# Patient Record
Sex: Male | Born: 1960 | Race: White | Hispanic: No | Marital: Single | Smoking: Current every day smoker
Health system: Southern US, Community
[De-identification: ages and names within clinical notes are randomized; demographics above are authoritative.]

## PROBLEM LIST (undated history)

## (undated) DIAGNOSIS — H547 Unspecified visual loss: Secondary | ICD-10-CM

---

## 2006-09-08 ENCOUNTER — Emergency Department: Payer: Self-pay | Admitting: Emergency Medicine

## 2007-04-14 ENCOUNTER — Emergency Department: Payer: Self-pay | Admitting: Emergency Medicine

## 2009-10-19 ENCOUNTER — Emergency Department: Payer: Self-pay | Admitting: Emergency Medicine

## 2010-08-23 ENCOUNTER — Emergency Department: Payer: Self-pay | Admitting: Emergency Medicine

## 2014-11-19 ENCOUNTER — Emergency Department
Admission: EM | Admit: 2014-11-19 | Discharge: 2014-11-20 | Disposition: A | Discharge status: Home / Self Care | Payer: Self-pay | Attending: Emergency Medicine | Admitting: Emergency Medicine

## 2014-11-19 DIAGNOSIS — Y9389 Activity, other specified: Secondary | ICD-10-CM | POA: Insufficient documentation

## 2014-11-19 DIAGNOSIS — Y92009 Unspecified place in unspecified non-institutional (private) residence as the place of occurrence of the external cause: Secondary | ICD-10-CM | POA: Insufficient documentation

## 2014-11-19 DIAGNOSIS — Y998 Other external cause status: Secondary | ICD-10-CM | POA: Insufficient documentation

## 2014-11-19 DIAGNOSIS — X58XXXA Exposure to other specified factors, initial encounter: Secondary | ICD-10-CM | POA: Insufficient documentation

## 2014-11-19 DIAGNOSIS — S0501XA Injury of conjunctiva and corneal abrasion without foreign body, right eye, initial encounter: Secondary | ICD-10-CM | POA: Insufficient documentation

## 2014-11-19 MED ORDER — TETRACAINE HCL 0.5 % OP SOLN
1.0000 [drp] | Freq: Once | OPHTHALMIC | Status: AC
  Administered 2014-11-19: 2 [drp] via OPHTHALMIC

## 2014-11-19 MED ORDER — FLUORESCEIN SODIUM 1 MG OP STRP
ORAL_STRIP | OPHTHALMIC | Status: AC
  Filled 2014-11-19: qty 1

## 2014-11-19 MED ORDER — TETRACAINE HCL 0.5 % OP SOLN
OPHTHALMIC | Status: AC
  Administered 2014-11-19: 2 [drp] via OPHTHALMIC
  Filled 2014-11-19: qty 2

## 2014-11-19 MED ORDER — ERYTHROMYCIN 5 MG/GM OP OINT
1.0000 "application " | TOPICAL_OINTMENT | Freq: Four times a day (QID) | OPHTHALMIC | Status: DC
  Administered 2014-11-20: 1 via OPHTHALMIC

## 2014-11-19 NOTE — ED Notes (Signed)
Pt states was weed eating his yard at 1800 when something went into his right eye. Pt states "all i can see right now is a white blur" pt with red sclera noted.

## 2014-11-20 MED ORDER — ERYTHROMYCIN 5 MG/GM OP OINT
TOPICAL_OINTMENT | OPHTHALMIC | Status: AC
  Filled 2014-11-20: qty 1

## 2014-11-20 MED ORDER — KETOROLAC TROMETHAMINE 0.5 % OP SOLN: 1.0000 [drp] | Freq: Four times a day (QID) | OPHTHALMIC | Status: DC

## 2014-11-20 MED ORDER — GENTAMICIN SULFATE 0.3 % OP SOLN: 2.0000 [drp] | OPHTHALMIC | Status: DC

## 2014-11-20 NOTE — ED Provider Notes (Signed)
[facility] Emergency Department Provider Note ____________________________________________  Time seen: 2325  I have reviewed the triage vital signs and the nursing notes.   HISTORY  Chief Complaint Eye Injury  HPI Jack Wells is a 54 y.o. male with the complaint of sudden right eye foreign body sensation and pain, while weed eating, at home, about 2:30 this afternoon. He was delayed in reporting for evaluation because he had to wait for a ride. He describes right eye irritation, redness, pain, blurred vision. He is unclear as to what may contact with his eye. He denies wearing protective lenses or glasses today.  No past medical history on file.  There are no active problems to display for this patient.   No past surgical history on file.  Current Outpatient Rx  Name  Route  Sig  Dispense  Refill  . gentamicin (GARAMYCIN) 0.3 % ophthalmic solution   Right Eye   Place 2 drops into the right eye every 4 (four) hours.   5 mL   0   . ketorolac (ACULAR) 0.5 % ophthalmic solution   Right Eye   Place 1 drop into the right eye 4 (four) times daily.   5 mL   0     Allergies Review of patient's allergies indicates no known allergies.  No family history on file.  Social History History  Substance Use Topics  . Smoking status: Not on file  . Smokeless tobacco: Not on file  . Alcohol Use: Not on file    Review of Systems  Constitutional: Negative for fever. Eyes: Positive for visual changes, pain, redness, and foreign body sensation. ENT: Negative for sore throat. Neurological: Negative for headaches, focal weakness or numbness.  10-point ROS otherwise negative.  ____________________________________________   PHYSICAL EXAM:  VITAL SIGNS: ED Triage Vitals  Enc Vitals Group     BP 11/19/14 2223 132/86 mmHg     Pulse Rate 11/19/14 2223 73     Resp 11/19/14 2223 20     Temp 11/19/14 2223 97.8 F (36.6 C)     Temp Source 11/19/14 2223  Oral     SpO2 11/19/14 2223 99 %     Weight 11/19/14 2223 140 lb (63.504 kg)     Height 11/19/14 2223 5\' 9"  (1.753 m)     Head Cir --      Peak Flow --      Pain Score 11/19/14 2232 10     Pain Loc --      Pain Edu? --      Excl. in GC? --     Constitutional: Alert and oriented. Well appearing and in no distress. Eyes: Conjunctivae are injected on the right. Fluorescein dye uptake on the right eye, and a wide linear distribution overlying the iris. PERRL. Normal extraocular movements. ENT   Head: Normocephalic and atraumatic.   Nose: No congestion/rhinnorhea.   Mouth/Throat: Mucous membranes are moist.   Neck: No stridor. Respiratory: Normal respiratory effort without tachypnea nor retractions.  Neurologic:  Normal speech and language. No gross focal neurologic deficits are appreciated. Speech is normal. No gait instability. Skin:  Skin is warm, dry and intact. No rash noted. Psychiatric: Mood and affect are normal. Speech and behavior are normal. Patient exhibits appropriate insight and judgment. ____________________________________________  PROCEDURES  Procedure(s) performed: None  Critical Care performed: No  _____ Current Outpatient Rx  Name  Route  Sig  Dispense  Refill  . gentamicin (GARAMYCIN) 0.3 % ophthalmic solution   Right  Eye   Place 2 drops into the right eye every 4 (four) hours.   5 mL   0   . ketorolac (ACULAR) 0.5 % ophthalmic solution   Right Eye   Place 1 drop into the right eye 4 (four) times daily.   5 mL   0    _______________________________________   INITIAL IMPRESSION / ASSESSMENT AND PLAN / ED COURSE  Acute corneal abrasion.  Discussed return to ED or [facility] as needed.   Pertinent labs & imaging results that were available during my care of the patient were reviewed by me and considered in my medical decision making (see chart for details). ____________________________________________  FINAL CLINICAL  IMPRESSION(S) / ED DIAGNOSES  Final diagnoses:  Corneal abrasion, right, initial encounter     Lissa HoardJenise V Bacon Raevon Broom, PA-C 11/20/14 0028  Governor Rooksebecca Lord, MD 11/23/14 1257

## 2014-11-20 NOTE — Discharge Instructions (Signed)
Corneal Abrasion The cornea is the clear covering at the front and center of the eye. When looking at the colored portion of the eye (iris), you are looking through the cornea. This very thin tissue is made up of many layers. The surface layer is a single layer of cells (corneal epithelium) and is one of the most sensitive tissues in the body. If a scratch or injury causes the corneal epithelium to come off, it is called a corneal abrasion. If the injury extends to the tissues below the epithelium, the condition is called a corneal ulcer. CAUSES   Scratches.  Trauma.  Foreign body in the eye. Some people have recurrences of abrasions in the area of the original injury even after it has healed (recurrent erosion syndrome). Recurrent erosion syndrome generally improves and goes away with time. SYMPTOMS   Eye pain.  Difficulty or inability to keep the injured eye open.  The eye becomes very sensitive to light.  Recurrent erosions tend to happen suddenly, first thing in the morning, usually after waking up and opening the eye. DIAGNOSIS  Your health care provider can diagnose a corneal abrasion during an eye exam. Dye is usually placed in the eye using a drop or a small paper strip moistened by your tears. When the eye is examined with a special light, the abrasion shows up clearly because of the dye. TREATMENT   Small abrasions may be treated with antibiotic drops or ointment alone.  A pressure patch may be put over the eye. If this is done, follow your doctor's instructions for when to remove the patch. Do not drive or use machines while the eye patch is on. Judging distances is hard to do with a patch on. If the abrasion becomes infected and spreads to the deeper tissues of the cornea, a corneal ulcer can result. This is serious because it can cause corneal scarring. Corneal scars interfere with light passing through the cornea and cause a loss of vision in the involved eye. HOME CARE  INSTRUCTIONS  Use medicine or ointment as directed. Only take over-the-counter or prescription medicines for pain, discomfort, or fever as directed by your health care provider.  Do not drive or operate machinery if your eye is patched. Your ability to judge distances is impaired.  If your health care provider has given you a follow-up appointment, it is very important to keep that appointment. Not keeping the appointment could result in a severe eye infection or permanent loss of vision. If there is any problem keeping the appointment, let your health care provider know. SEEK MEDICAL CARE IF:   You have pain, light sensitivity, and a scratchy feeling in one eye or both eyes.  Your pressure patch keeps loosening up, and you can blink your eye under the patch after treatment.  Any kind of discharge develops from the eye after treatment or if the lids stick together in the morning.  You have the same symptoms in the morning as you did with the original abrasion days, weeks, or months after the abrasion healed. MAKE SURE YOU:   Understand these instructions.  Will watch your condition.  Will get help right away if you are not doing well or get worse. Document Released: 06/28/2000 Document Revised: 07/06/2013 Document Reviewed: 03/08/2013 [facility] Patient Information 2015 [employer] [place]. This information is not intended to replace advice given to you by your health care provider. Make sure you discuss any questions you have with your health care provider.   Use  the eye drops as directed.  Wear contacts to protect the eyes.  Return to the ED as needed for eye pain.

## 2017-12-18 ENCOUNTER — Encounter: Payer: Self-pay | Admitting: Physician Assistant

## 2017-12-18 ENCOUNTER — Other Ambulatory Visit: Payer: Self-pay

## 2017-12-18 ENCOUNTER — Emergency Department
Admission: EM | Admit: 2017-12-18 | Discharge: 2017-12-18 | Disposition: A | Discharge status: Home / Self Care | Payer: No Typology Code available for payment source | Attending: Emergency Medicine | Admitting: Emergency Medicine

## 2017-12-18 ENCOUNTER — Emergency Department: Payer: No Typology Code available for payment source

## 2017-12-18 DIAGNOSIS — Y999 Unspecified external cause status: Secondary | ICD-10-CM | POA: Diagnosis not present

## 2017-12-18 DIAGNOSIS — S161XXA Strain of muscle, fascia and tendon at neck level, initial encounter: Secondary | ICD-10-CM | POA: Insufficient documentation

## 2017-12-18 DIAGNOSIS — Y92410 Unspecified street and highway as the place of occurrence of the external cause: Secondary | ICD-10-CM | POA: Diagnosis not present

## 2017-12-18 DIAGNOSIS — Y9389 Activity, other specified: Secondary | ICD-10-CM | POA: Diagnosis not present

## 2017-12-18 DIAGNOSIS — S199XXA Unspecified injury of neck, initial encounter: Secondary | ICD-10-CM | POA: Diagnosis present

## 2017-12-18 DIAGNOSIS — Z79899 Other long term (current) drug therapy: Secondary | ICD-10-CM | POA: Diagnosis not present

## 2017-12-18 MED ORDER — BACLOFEN 10 MG PO TABS: 10.0000 mg | ORAL_TABLET | Freq: Every day | ORAL | 1 refills | Status: AC

## 2017-12-18 MED ORDER — ACETAMINOPHEN 325 MG PO TABS
650.0000 mg | ORAL_TABLET | Freq: Once | ORAL | Status: AC
  Administered 2017-12-18: 650 mg via ORAL
  Filled 2017-12-18: qty 2

## 2017-12-18 MED ORDER — MELOXICAM 15 MG PO TABS: 15.0000 mg | ORAL_TABLET | Freq: Every day | ORAL | 2 refills | Status: AC

## 2017-12-18 NOTE — Discharge Instructions (Signed)
Follow-up with your regular doctor if not better in 5 to 7 days.  Take medication as prescribed.  Apply ice to the neck and back.  In 3 days she can switch to wet heat followed by ice.  Return to emergency department if you are worsening.
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
## 2021-08-27 ENCOUNTER — Emergency Department
Admission: EM | Admit: 2021-08-27 | Discharge: 2021-08-27 | Disposition: A | Discharge status: Home / Self Care | Payer: Self-pay | Attending: Emergency Medicine | Admitting: Emergency Medicine

## 2021-08-27 ENCOUNTER — Emergency Department: Payer: Self-pay

## 2021-08-27 ENCOUNTER — Other Ambulatory Visit: Payer: Self-pay

## 2021-08-27 DIAGNOSIS — M79604 Pain in right leg: Secondary | ICD-10-CM | POA: Insufficient documentation

## 2021-08-27 DIAGNOSIS — R911 Solitary pulmonary nodule: Secondary | ICD-10-CM | POA: Insufficient documentation

## 2021-08-27 DIAGNOSIS — R059 Cough, unspecified: Secondary | ICD-10-CM | POA: Insufficient documentation

## 2021-08-27 DIAGNOSIS — R0602 Shortness of breath: Secondary | ICD-10-CM | POA: Insufficient documentation

## 2021-08-27 DIAGNOSIS — R0600 Dyspnea, unspecified: Secondary | ICD-10-CM | POA: Insufficient documentation

## 2021-08-27 DIAGNOSIS — R079 Chest pain, unspecified: Secondary | ICD-10-CM | POA: Insufficient documentation

## 2021-08-27 DIAGNOSIS — F17200 Nicotine dependence, unspecified, uncomplicated: Secondary | ICD-10-CM | POA: Insufficient documentation

## 2021-08-27 LAB — BASIC METABOLIC PANEL
Anion gap: 9 (ref 5–15)
BUN: 11 mg/dL (ref 8–23)
CO2: 25 mmol/L (ref 22–32)
Calcium: 9.3 mg/dL (ref 8.9–10.3)
Chloride: 103 mmol/L (ref 98–111)
Creatinine, Ser: 0.81 mg/dL (ref 0.61–1.24)
GFR, Estimated: >60 mL/min (ref >60)
Glucose, Bld: 88 mg/dL (ref 70–99)
Potassium: 3.8 mmol/L (ref 3.5–5.1)
Sodium: 137 mmol/L (ref 135–145)

## 2021-08-27 LAB — TROPONIN I (HIGH SENSITIVITY)
Troponin I (High Sensitivity): 5 ng/L (ref <18)
Troponin I (High Sensitivity): 5 ng/L (ref <18)

## 2021-08-27 LAB — D-DIMER, QUANTITATIVE: D-Dimer, Quant: <0.27 ug/mL-FEU (ref 0.00–0.50)

## 2021-08-27 LAB — CBC
HCT: 47 % (ref 39.0–52.0)
Hemoglobin: 15.5 g/dL (ref 13.0–17.0)
MCH: 31.5 pg (ref 26.0–34.0)
MCHC: 33 g/dL (ref 30.0–36.0)
MCV: 95.5 fL (ref 80.0–100.0)
Platelets: 159 10*3/uL (ref 150–400)
RBC: 4.92 MIL/uL (ref 4.22–5.81)
RDW: 12.8 % (ref 11.5–15.5)
WBC: 5.9 10*3/uL (ref 4.0–10.5)
nRBC: 0 % (ref 0.0–0.2)

## 2021-08-27 MED ORDER — ACETAMINOPHEN 500 MG PO TABS
1000.0000 mg | ORAL_TABLET | Freq: Once | ORAL | Status: AC
  Administered 2021-08-27: 1000 mg via ORAL
  Filled 2021-08-27: qty 2

## 2021-08-27 NOTE — ED Triage Notes (Signed)
Pt to ED for centralized cp for 2 weeks. Reports worsening today. +shob. RR Even and unlabored.

## 2021-08-27 NOTE — ED Provider Notes (Signed)
[facility] Provider Note    Event Date/Time   First MD Initiated Contact with Patient 08/27/21 1449     (approximate)   History   Chest Pain and Shortness of Breath   HPI  Jack Wells is a 62 y.o. male  with pmh tobacco use presents wth chest pain.  Symptoms been going on for the past 2 weeks.  Patient endorses a central sharp pain in his chest that is constant.  Pain is worse with coughing.  He has chronic cough from his chronic tobacco use.  Denies hemoptysis.  Denies fevers chills.  Does feel slightly dyspneic.  Pain is not worse with exertion or pleuritic.  Has some pain in the right lower leg over the last 2 days. The patient denies hx of prior DVT/PE, unilateral leg pain/swelling, hormone use, recent surgery, hx of cancer, prolonged immobilization, or hemoptysis.         History reviewed. No pertinent past medical history.  There are no problems to display for this patient.    Physical Exam  Triage Vital Signs: ED Triage Vitals  Enc Vitals Group     BP 08/27/21 1002 (!) 159/90     Pulse Rate 08/27/21 1002 85     Resp 08/27/21 1002 18     Temp 08/27/21 1002 98 F (36.7 C)     Temp src --      SpO2 08/27/21 1002 98 %     Weight 08/27/21 1002 140 lb (63.5 kg)     Height 08/27/21 1002 5\' 10"  (1.778 m)     Head Circumference --      Peak Flow --      Pain Score 08/27/21 1002 9     Pain Loc --      Pain Edu? --      Excl. in [place]? --     Most recent vital signs: Vitals:   08/27/21 1002 08/27/21 1358  BP: (!) 159/90 (!) 149/85  Pulse: 85 67  Resp: 18 18  Temp: 98 F (36.7 C)   SpO2: 98% 98%     General: Awake, no distress.  CV:  Good peripheral perfusion.  No lower extremity edema or asymmetry Resp:  Normal effort. No wheezing Abd:  No distention.  Neuro:             Awake, Alert, Oriented x 3  Other:     ED Results / Procedures / Treatments  Labs (all labs ordered are listed, but only abnormal results are  displayed) Labs Reviewed  BASIC METABOLIC PANEL  CBC  D-DIMER, QUANTITATIVE  TROPONIN I (HIGH SENSITIVITY)  TROPONIN I (HIGH SENSITIVITY)     EKG  EKG interpretation performed by myself: NSR, nml axis, nml intervals, no acute ischemic changes    RADIOLOGY Possible right upper pulmonary nodule, otherwise no acute cardiopulmonary process   PROCEDURES:  Critical Care performed: No   MEDICATIONS ORDERED IN ED: Medications  acetaminophen (TYLENOL) tablet 1,000 mg (1,000 mg Oral Given 08/27/21 1533)     IMPRESSION / MDM / ASSESSMENT AND PLAN / ED COURSE  I reviewed the triage vital signs and the nursing notes.                              Differential diagnosis includes, but is not limited to, pulmonary embolism, malignancy, ACS, pneumonia  This patient is a 61 year old male with past medical history of heavy tobacco use who  presents with 2 weeks of sharp centralized chest pain that is nonexertional.  He has some associated shortness of breath.  Vital signs are within normal limits.  On exam he is thin with clear lungs no asymmetry in his lower extremities.  I reviewed his EKG which has no acute ischemic changes.  Chest x-ray read as radiology as potential right upper lung nodule but no other acute process.  His symptomatology is really not consistent with ACS.  2 troponins are negative.  Am however concerned for pulmonary embolism especially if this lung nodule is a potential malignancy.  We will obtain a D-dimer.  Patient's D-dimer is negative.  Discussed the pulmonary nodule with the patient and need to follow-up to obtain CT.  No indication to obtain a CT today in the ED as this would not change acute management.     FINAL CLINICAL IMPRESSION(S) / ED DIAGNOSES   Final diagnoses:  Pulmonary nodule     Rx / DC Orders   ED Discharge Orders     None        Note:  This document was prepared using Dragon voice recognition software and may include unintentional  dictation errors.   Rada Hay, MD 08/27/21 [phone]

## 2021-08-27 NOTE — Discharge Instructions (Signed)
Your cardiac enzymes and the test for blood clots was all reassuring.  On your x-ray you may have a pulmonary nodule.  You will need to follow-up with a primary care doctor to have a CT of your chest to follow this.  We are not sure exactly why you are having chest pain.  You can take Tylenol and Motrin for the pain.

## 2021-09-20 ENCOUNTER — Encounter (HOSPITAL_COMMUNITY): Payer: Self-pay | Admitting: Radiology

## 2022-07-17 ENCOUNTER — Encounter: Payer: Self-pay | Admitting: Ophthalmology

## 2022-07-22 NOTE — Discharge Instructions (Signed)

## 2022-07-23 ENCOUNTER — Encounter: Payer: Self-pay | Admitting: Ophthalmology

## 2022-07-23 ENCOUNTER — Other Ambulatory Visit: Payer: Self-pay

## 2022-07-23 ENCOUNTER — Ambulatory Visit: Payer: Medicaid Other | Admitting: General Practice

## 2022-07-23 ENCOUNTER — Ambulatory Visit
Admission: RE | Admit: 2022-07-23 | Discharge: 2022-07-23 | Disposition: A | Discharge status: Home / Self Care | Payer: Medicaid Other | Attending: Ophthalmology | Admitting: Ophthalmology

## 2022-07-23 ENCOUNTER — Encounter
Admission: RE | Disposition: A | Discharge status: Home / Self Care | Payer: Self-pay | Source: Home / Self Care | Attending: Ophthalmology

## 2022-07-23 DIAGNOSIS — F17210 Nicotine dependence, cigarettes, uncomplicated: Secondary | ICD-10-CM | POA: Diagnosis not present

## 2022-07-23 DIAGNOSIS — Z79899 Other long term (current) drug therapy: Secondary | ICD-10-CM | POA: Diagnosis not present

## 2022-07-23 DIAGNOSIS — E1136 Type 2 diabetes mellitus with diabetic cataract: Secondary | ICD-10-CM | POA: Insufficient documentation

## 2022-07-23 DIAGNOSIS — I10 Essential (primary) hypertension: Secondary | ICD-10-CM | POA: Insufficient documentation

## 2022-07-23 DIAGNOSIS — H2512 Age-related nuclear cataract, left eye: Secondary | ICD-10-CM | POA: Diagnosis present

## 2022-07-23 HISTORY — DX: Unspecified visual loss: H54.7

## 2022-07-23 HISTORY — PX: CATARACT EXTRACTION W/PHACO: SHX586

## 2022-07-23 SURGERY — PHACOEMULSIFICATION, CATARACT, WITH IOL INSERTION
Anesthesia: Monitor Anesthesia Care | Laterality: Left

## 2022-07-23 MED ORDER — BRIMONIDINE TARTRATE-TIMOLOL 0.2-0.5 % OP SOLN
OPHTHALMIC | Status: DC | PRN
  Administered 2022-07-23: 1 [drp] via OPHTHALMIC

## 2022-07-23 MED ORDER — SIGHTPATH DOSE#1 BSS IO SOLN
INTRAOCULAR | Status: DC | PRN
  Administered 2022-07-23: 15 mL via INTRAOCULAR

## 2022-07-23 MED ORDER — TRYPAN BLUE 0.06 % IO SOSY
PREFILLED_SYRINGE | INTRAOCULAR | Status: DC | PRN
  Administered 2022-07-23: .5 mL via INTRAOCULAR

## 2022-07-23 MED ORDER — ONDANSETRON HCL 4 MG/2ML IJ SOLN: 4.0000 mg | Freq: Once | INTRAMUSCULAR | Status: DC | PRN

## 2022-07-23 MED ORDER — SIGHTPATH DOSE#1 NA CHONDROIT SULF-NA HYALURON 40-17 MG/ML IO SOLN
INTRAOCULAR | Status: DC | PRN
  Administered 2022-07-23: 1 mL via INTRAOCULAR

## 2022-07-23 MED ORDER — SIGHTPATH DOSE#1 BSS IO SOLN
INTRAOCULAR | Status: DC | PRN
  Administered 2022-07-23: 85 mL via OPHTHALMIC

## 2022-07-23 MED ORDER — LACTATED RINGERS IV SOLN: INTRAVENOUS | Status: DC

## 2022-07-23 MED ORDER — ARMC OPHTHALMIC DILATING DROPS
1.0000 | OPHTHALMIC | Status: DC | PRN
  Administered 2022-07-23 (×3): 1 via OPHTHALMIC

## 2022-07-23 MED ORDER — TETRACAINE HCL 0.5 % OP SOLN
1.0000 [drp] | OPHTHALMIC | Status: DC | PRN
  Administered 2022-07-23 (×3): 1 [drp] via OPHTHALMIC

## 2022-07-23 MED ORDER — FENTANYL CITRATE (PF) 100 MCG/2ML IJ SOLN
INTRAMUSCULAR | Status: DC | PRN
  Administered 2022-07-23: 50 ug via INTRAVENOUS

## 2022-07-23 MED ORDER — FENTANYL CITRATE PF 50 MCG/ML IJ SOSY: 25.0000 ug | PREFILLED_SYRINGE | INTRAMUSCULAR | Status: DC | PRN

## 2022-07-23 MED ORDER — MIDAZOLAM HCL 2 MG/2ML IJ SOLN
INTRAMUSCULAR | Status: DC | PRN
  Administered 2022-07-23: 2 mg via INTRAVENOUS

## 2022-07-23 MED ORDER — SIGHTPATH DOSE#1 BSS IO SOLN
INTRAOCULAR | Status: DC | PRN
  Administered 2022-07-23: 2 mL

## 2022-07-23 MED ORDER — MOXIFLOXACIN HCL 0.5 % OP SOLN
OPHTHALMIC | Status: DC | PRN
  Administered 2022-07-23: .2 mL via OPHTHALMIC

## 2022-07-23 SURGICAL SUPPLY — 13 items
CANNULA ANT/CHMB 27G (MISCELLANEOUS) IMPLANT
CANNULA ANT/CHMB 27GA (MISCELLANEOUS) ×1 IMPLANT
CATARACT SUITE SIGHTPATH (MISCELLANEOUS) ×1 IMPLANT
FEE CATARACT SUITE SIGHTPATH (MISCELLANEOUS) ×1 IMPLANT
GLOVE SURG ENC TEXT LTX SZ8 (GLOVE) ×1 IMPLANT
GLOVE SURG TRIUMPH 8.0 PF LTX (GLOVE) ×1 IMPLANT
LENS IOL TECNIS EYHANCE 23.0 (Intraocular Lens) IMPLANT
NDL FILTER BLUNT 18X1 1/2 (NEEDLE) ×1 IMPLANT
NEEDLE FILTER BLUNT 18X1 1/2 (NEEDLE) ×1 IMPLANT
SUT ETHILON 10-0 CS-B-6CS-B-6 (SUTURE) ×1
SUTURE EHLN 10-0 CS-B-6CS-B-6 (SUTURE) IMPLANT
SYR 3ML LL SCALE MARK (SYRINGE) ×1 IMPLANT
WATER STERILE IRR 250ML POUR (IV SOLUTION) ×1 IMPLANT

## 2022-07-23 NOTE — Anesthesia Preprocedure Evaluation (Signed)
Anesthesia Evaluation  Patient identified by MRN, date of birth, ID band Patient awake    Reviewed: Allergy & Precautions, H&P , NPO status , Patient's Chart, lab work & pertinent test results, reviewed documented beta blocker date and time   Airway Mallampati: II  TM Distance: >3 FB Neck ROM: full    Dental no notable dental hx. (+) Teeth Intact   Pulmonary neg pulmonary ROS, Current Smoker   Pulmonary exam normal breath sounds clear to auscultation       Cardiovascular Exercise Tolerance: Good hypertension, On Medications negative cardio ROS  Rhythm:regular Rate:Normal     Neuro/Psych negative neurological ROS  negative psych ROS   GI/Hepatic negative GI ROS, Neg liver ROS,,,  Endo/Other  negative endocrine ROSdiabetes, Well Controlled    Renal/GU      Musculoskeletal   Abdominal   Peds  Hematology negative hematology ROS (+)   Anesthesia Other Findings   Reproductive/Obstetrics negative OB ROS                             Anesthesia Physical Anesthesia Plan  ASA: 3  Anesthesia Plan: MAC   Post-op Pain Management:    Induction:   PONV Risk Score and Plan:   Airway Management Planned:   Additional Equipment:   Intra-op Plan:   Post-operative Plan:   Informed Consent: I have reviewed the patients History and Physical, chart, labs and discussed the procedure including the risks, benefits and alternatives for the proposed anesthesia with the patient or authorized representative who has indicated his/her understanding and acceptance.       Plan Discussed with: CRNA  Anesthesia Plan Comments:        Anesthesia Quick Evaluation

## 2022-07-23 NOTE — H&P (Signed)
Jack Wells   Primary Care Physician:  [facility] Ophthalmologist: Dr. George Ina  Pre-Procedure History & Physical: HPI:  Jack Wells is a 62 y.o. male here for cataract surgery.   Past Medical History:  Diagnosis Date   Blind    sees shadows    History reviewed. No pertinent surgical history.  Prior to Admission medications   Not on File    Allergies as of 06/17/2022   (No Known Allergies)    History reviewed. No pertinent family history.  Social History   Socioeconomic History   Marital status: Single    Spouse name: Not on file   Number of children: Not on file   Years of education: Not on file   Highest education level: Not on file  Occupational History   Not on file  Tobacco Use   Smoking status: Every Day    Packs/day: 1.00    Years: 45.00    Total pack years: 45.00    Types: Cigarettes   Smokeless tobacco: Never   Tobacco comments:    Started smoking as teenager  Vaping Use   Vaping Use: Never used  Substance and Sexual Activity   Alcohol use: Yes    Comment: occasional beer   Drug use: Not Currently   Sexual activity: Not on file  Other Topics Concern   Not on file  Social History Narrative   Not on file   Social Determinants of Health   Financial Resource Strain: Not on file  Food Insecurity: Not on file  Transportation Needs: Not on file  Physical Activity: Not on file  Stress: Not on file  Social Connections: Not on file  Intimate Partner Violence: Not on file    Review of Systems: See HPI, otherwise negative ROS  Physical Exam: BP (!) 143/75   Pulse 68   Temp 97.7 F (36.5 C) (Temporal)   Resp 18   Ht 5\' 10"  (1.778 m)   Wt 63.5 kg   SpO2 98%   BMI 20.09 kg/m  General:   Alert, cooperative in NAD Head:  Normocephalic and atraumatic. Respiratory:  Normal work of breathing. Cardiovascular:  RRR  Impression/Plan: Jack Wells is here for cataract surgery.  Risks, benefits, limitations, and  alternatives regarding cataract surgery have been reviewed with the patient.  Questions have been answered.  All parties agreeable.   Birder Robson, MD  07/23/2022, 11:00 AM

## 2022-07-23 NOTE — Transfer of Care (Signed)
Immediate Anesthesia Transfer of Care Note  Patient: Jack Wells  Procedure(s) Performed: CATARACT EXTRACTION PHACO AND INTRAOCULAR LENS PLACEMENT (IOC) LEFT VISION BLUE 33.23 02:25.4 (Left)  Patient Location: PACU  Anesthesia Type:MAC  Level of Consciousness: awake, alert , and oriented  Airway & Oxygen Therapy: Patient Spontanous Breathing  Post-op Assessment: Report given to RN  Post vital signs: Reviewed and stable  Last Vitals:  Vitals Value Taken Time  BP 138/102 07/23/22 1137  Temp 36.2 C 07/23/22 1136  Pulse 75 07/23/22 1138  Resp 15 07/23/22 1138  SpO2 97 % 07/23/22 1138  Vitals shown include unvalidated device data.  Last Pain:  Vitals:   07/23/22 1136  TempSrc:   PainSc: 0-No pain         Complications: No notable events documented.

## 2022-07-23 NOTE — Anesthesia Postprocedure Evaluation (Signed)
Anesthesia Post Note  Patient: Romney Compean  Procedure(s) Performed: CATARACT EXTRACTION PHACO AND INTRAOCULAR LENS PLACEMENT (IOC) LEFT VISION BLUE 33.23 02:25.4 (Left)  Patient location during evaluation: PACU Anesthesia Type: MAC Level of consciousness: awake and alert Pain management: pain level controlled Vital Signs Assessment: post-procedure vital signs reviewed and stable Respiratory status: spontaneous breathing, nonlabored ventilation, respiratory function stable and patient connected to nasal cannula oxygen Cardiovascular status: stable and blood pressure returned to baseline Postop Assessment: no apparent nausea or vomiting Anesthetic complications: no   No notable events documented.   Last Vitals:  Vitals:   07/23/22 1136 07/23/22 1141  BP: (!) 138/102 116/85  Pulse: 71 66  Resp: 14 10  Temp: (!) 36.2 C (!) 36.2 C  SpO2: 97% 97%    Last Pain:  Vitals:   07/23/22 1141  TempSrc:   PainSc: 0-No pain                 Molli Barrows

## 2022-07-23 NOTE — Op Note (Signed)
PREOPERATIVE DIAGNOSIS:  Nuclear sclerotic cataract of the left eye.   POSTOPERATIVE DIAGNOSIS:  Nuclear sclerotic cataract of the left eye.   OPERATIVE PROCEDURE:ORPROCALL@   SURGEON:  Birder Robson, MD.   ANESTHESIA:  Anesthesiologist: Molli Barrows, MD CRNA: Gigi Gin, CRNA  1.      Managed anesthesia care. 2.     0.34ml of Shugarcaine was instilled following the paracentesis   COMPLICATIONS:  Vision Blue was used to stain the anterior capsule due to very poor/ no visualization of the red reflex.    TECHNIQUE:   Stop and chop   DESCRIPTION OF PROCEDURE:  The patient was examined and consented in the preoperative holding area where the aforementioned topical anesthesia was applied to the left eye and then brought back to the Operating Room where the left eye was prepped and draped in the usual sterile ophthalmic fashion and a lid speculum was placed. A paracentesis was created with the side port blade and the anterior chamber was filled with viscoelastic. A near clear corneal incision was performed with the steel keratome. A continuous curvilinear capsulorrhexis was performed with a cystotome followed by the capsulorrhexis forceps. Hydrodissection and hydrodelineation were carried out with BSS on a blunt cannula. The lens was removed in a stop and chop  technique and the remaining cortical material was removed with the irrigation-aspiration handpiece. The capsular bag was inflated with viscoelastic and the Technis ZCB00 lens was placed in the capsular bag without complication. The remaining viscoelastic was removed from the eye with the irrigation-aspiration handpiece. The wounds were hydrated. A single 10-0 suture was placed. The anterior chamber was flushed with BSS and the eye was inflated to physiologic pressure. 0.31ml Vigamox was placed in the anterior chamber. The wounds were found to be water tight. The eye was dressed with Combigan. The patient was given protective glasses to  wear throughout the day and a shield with which to sleep tonight. The patient was also given drops with which to begin a drop regimen today and will follow-up with me in one day. Implant Name Type Inv. Item Serial No. Manufacturer Lot No. LRB No. Used Action  LENS IOL TECNIS EYHANCE 23.0 - [id number] Intraocular Lens LENS IOL TECNIS EYHANCE 23.0 [id number] [employer]  Left 1 Implanted    Procedure(s): CATARACT EXTRACTION PHACO AND INTRAOCULAR LENS PLACEMENT (IOC) LEFT VISION BLUE 33.23 02:25.4 (Left)  Electronically signed: Birder Robson 07/23/2022 11:36 AM

## 2022-07-24 ENCOUNTER — Encounter: Payer: Self-pay | Admitting: Ophthalmology

## 2022-08-05 NOTE — Discharge Instructions (Signed)
   Cataract Surgery, Care After ? ?This sheet gives you information about how to care for yourself after your surgery.  Your ophthalmologist may also give you more specific instructions.  If you have problems or questions, contact your doctor at [facility], [phone]. ? ?What can I expect after the surgery? ?It is common to have: ?Itching ?Foreign body sensation (feels like a grain of sand in the eye) ?Watery discharge (excess tearing) ?Sensitivity to light and touch ?Bruising in or around the eye ?Mild blurred vision ? ?Follow these instructions at home: ?Do not touch or rub your eyes. ?You may be told to wear a protective shield or sunglasses to protect your eyes. ?Do not put a contact lens in the operative eye unless your doctor approves. ?Keep the lids and face clean and dry. ?Do not allow water to hit you directly in the face while showering. ?Keep soap and shampoo out of your eyes. ?Do not use eye makeup for 1 week. ? ?Check your eye every day for signs of infection.  Watch for: ?Redness, swelling, or pain. ?Fluid, blood or pus. ?Worsening vision. ?Worsening sensitivity to light or touch. ? ?Activity: ?During the first day, avoid bending over and reading.  You may resume reading and bending the next day. ?Do not drive or use heavy machinery for at least 24 hours. ?Avoid strenuous activities for 1 week.  Activities such as walking, treadmill, exercise bike, and climbing stairs are okay. ?Do not lift heavy (>20 pound) objects for 1 week. ?Do not do yardwork, gardening, or dirty housework (mopping, cleaning bathrooms, vacuuming, etc.) for 1 week. ?Do not swim or use a hot tub for 2 weeks. ?Ask your doctor when you can return to work. ? ?General Instructions: ?Take or apply prescription and over-the-counter medicines as directed by your doctor, including eyedrops and ointments. ?Resume medications discontinued prior to surgery, unless told otherwise by your doctor. ?Keep all follow up appointments as  scheduled. ? ?Contact a health care provider if: ?You have increased bruising around your eye. ?You have pain that is not helped with medication. ?You have a fever. ?You have fluid, pus, or blood coming from your eye or incision. ?Your sensitivity to light gets worse. ?You have spots (floaters) of flashing lights in your vision. ?You have nausea or vomiting. ? ?Go to the nearest emergency room or call 911 if: ?You have sudden loss of vision. ?You have severe, worsening eye pain. ? ?

## 2022-08-06 ENCOUNTER — Encounter
Admission: RE | Disposition: A | Discharge status: Home / Self Care | Payer: Self-pay | Source: Home / Self Care | Attending: Ophthalmology

## 2022-08-06 ENCOUNTER — Encounter: Payer: Self-pay | Admitting: Ophthalmology

## 2022-08-06 ENCOUNTER — Other Ambulatory Visit: Payer: Self-pay

## 2022-08-06 ENCOUNTER — Ambulatory Visit
Admission: RE | Admit: 2022-08-06 | Discharge: 2022-08-06 | Disposition: A | Discharge status: Home / Self Care | Payer: Medicaid Other | Attending: Ophthalmology | Admitting: Ophthalmology

## 2022-08-06 ENCOUNTER — Ambulatory Visit: Payer: Medicaid Other | Admitting: Anesthesiology

## 2022-08-06 DIAGNOSIS — H2511 Age-related nuclear cataract, right eye: Secondary | ICD-10-CM | POA: Diagnosis not present

## 2022-08-06 DIAGNOSIS — E119 Type 2 diabetes mellitus without complications: Secondary | ICD-10-CM | POA: Diagnosis not present

## 2022-08-06 DIAGNOSIS — H547 Unspecified visual loss: Secondary | ICD-10-CM | POA: Diagnosis not present

## 2022-08-06 DIAGNOSIS — I10 Essential (primary) hypertension: Secondary | ICD-10-CM | POA: Insufficient documentation

## 2022-08-06 DIAGNOSIS — F17210 Nicotine dependence, cigarettes, uncomplicated: Secondary | ICD-10-CM | POA: Diagnosis not present

## 2022-08-06 HISTORY — PX: CATARACT EXTRACTION W/PHACO: SHX586

## 2022-08-06 SURGERY — PHACOEMULSIFICATION, CATARACT, WITH IOL INSERTION
Anesthesia: Monitor Anesthesia Care | Site: Eye | Laterality: Right

## 2022-08-06 MED ORDER — MIDAZOLAM HCL 2 MG/2ML IJ SOLN
INTRAMUSCULAR | Status: DC | PRN
  Administered 2022-08-06: 2 mg via INTRAVENOUS

## 2022-08-06 MED ORDER — ONDANSETRON HCL 4 MG/2ML IJ SOLN: 4.0000 mg | Freq: Once | INTRAMUSCULAR | Status: DC | PRN

## 2022-08-06 MED ORDER — MOXIFLOXACIN HCL 0.5 % OP SOLN
OPHTHALMIC | Status: DC | PRN
  Administered 2022-08-06: .2 mL via OPHTHALMIC

## 2022-08-06 MED ORDER — BRIMONIDINE TARTRATE-TIMOLOL 0.2-0.5 % OP SOLN
OPHTHALMIC | Status: DC | PRN
  Administered 2022-08-06: 1 [drp] via OPHTHALMIC

## 2022-08-06 MED ORDER — ACETAMINOPHEN 160 MG/5ML PO SOLN: 325.0000 mg | ORAL | Status: DC | PRN

## 2022-08-06 MED ORDER — ARMC OPHTHALMIC DILATING DROPS
1.0000 | OPHTHALMIC | Status: DC | PRN
  Administered 2022-08-06 (×3): 1 via OPHTHALMIC

## 2022-08-06 MED ORDER — SIGHTPATH DOSE#1 BSS IO SOLN
INTRAOCULAR | Status: DC | PRN
  Administered 2022-08-06: 15 mL via INTRAOCULAR

## 2022-08-06 MED ORDER — SIGHTPATH DOSE#1 BSS IO SOLN
INTRAOCULAR | Status: DC | PRN
  Administered 2022-08-06: 84 mL via OPHTHALMIC

## 2022-08-06 MED ORDER — ACETAMINOPHEN 325 MG PO TABS: 650.0000 mg | ORAL_TABLET | Freq: Once | ORAL | Status: DC | PRN

## 2022-08-06 MED ORDER — TETRACAINE HCL 0.5 % OP SOLN
1.0000 [drp] | OPHTHALMIC | Status: DC | PRN
  Administered 2022-08-06 (×3): 1 [drp] via OPHTHALMIC

## 2022-08-06 MED ORDER — LACTATED RINGERS IV SOLN: INTRAVENOUS | Status: DC

## 2022-08-06 MED ORDER — TRYPAN BLUE 0.06 % IO SOSY
PREFILLED_SYRINGE | INTRAOCULAR | Status: DC | PRN
  Administered 2022-08-06: .5 mL via INTRAOCULAR

## 2022-08-06 MED ORDER — SIGHTPATH DOSE#1 NA CHONDROIT SULF-NA HYALURON 40-17 MG/ML IO SOLN
INTRAOCULAR | Status: DC | PRN
  Administered 2022-08-06: 1 mL via INTRAOCULAR

## 2022-08-06 MED ORDER — SIGHTPATH DOSE#1 BSS IO SOLN
INTRAOCULAR | Status: DC | PRN
  Administered 2022-08-06: 2 mL

## 2022-08-06 MED ORDER — FENTANYL CITRATE (PF) 100 MCG/2ML IJ SOLN
INTRAMUSCULAR | Status: DC | PRN
  Administered 2022-08-06: 50 ug via INTRAVENOUS

## 2022-08-06 SURGICAL SUPPLY — 15 items
CANNULA ANT/CHMB 27G (MISCELLANEOUS) IMPLANT
CANNULA ANT/CHMB 27GA (MISCELLANEOUS) ×1 IMPLANT
CATARACT SUITE SIGHTPATH (MISCELLANEOUS) ×1 IMPLANT
FEE CATARACT SUITE SIGHTPATH (MISCELLANEOUS) ×1 IMPLANT
GLOVE BIOGEL PI IND STRL 8 (GLOVE) ×1 IMPLANT
GLOVE SURG ENC TEXT LTX SZ8 (GLOVE) ×1 IMPLANT
LENS IOL TECNIS EYHANCE 22.5 (Intraocular Lens) IMPLANT
NDL FILTER BLUNT 18X1 1/2 (NEEDLE) ×1 IMPLANT
NEEDLE FILTER BLUNT 18X1 1/2 (NEEDLE) ×1 IMPLANT
PACK VIT ANT 23G (MISCELLANEOUS) IMPLANT
RING MALYGIN (MISCELLANEOUS) IMPLANT
SUT ETHILON 10-0 CS-B-6CS-B-6 (SUTURE)
SUTURE EHLN 10-0 CS-B-6CS-B-6 (SUTURE) IMPLANT
SYR 3ML LL SCALE MARK (SYRINGE) ×1 IMPLANT
WATER STERILE IRR 250ML POUR (IV SOLUTION) ×1 IMPLANT

## 2022-08-06 NOTE — Transfer of Care (Signed)
Immediate Anesthesia Transfer of Care Note  Patient: Jack Wells  Procedure(s) Performed: CATARACT EXTRACTION PHACO AND INTRAOCULAR LENS PLACEMENT (IOC) RIGHT VISION BLUE 39.36 2:41.0 (Right: Eye)  Patient Location: PACU  Anesthesia Type:MAC  Level of Consciousness: awake, alert , and oriented  Airway & Oxygen Therapy: Patient Spontanous Breathing  Post-op Assessment: Report given to RN  Post vital signs: Reviewed and stable  Last Vitals:  Vitals Value Taken Time  BP 120/75 08/06/22 1318  Temp 36.1 C 08/06/22 1318  Pulse 71 08/06/22 1319  Resp 9 08/06/22 1319  SpO2 98 % 08/06/22 1319  Vitals shown include unvalidated device data.  Last Pain:  Vitals:   08/06/22 1318  TempSrc:   PainSc: 0-No pain         Complications: No notable events documented.

## 2022-08-06 NOTE — H&P (Signed)
[place]   Primary Care Physician:  [facility] Ophthalmologist: Dr. George Ina  Pre-Procedure History & Physical: HPI:  Jack Wells is a 62 y.o. male here for cataract surgery.   Past Medical History:  Diagnosis Date   Blind    sees shadows    Past Surgical History:  Procedure Laterality Date   CATARACT EXTRACTION W/PHACO Left 07/23/2022   Procedure: CATARACT EXTRACTION PHACO AND INTRAOCULAR LENS PLACEMENT (IOC) LEFT VISION BLUE 33.23 02:25.4;  Surgeon: Birder Robson, MD;  Location: [facility];  Service: Ophthalmology;  Laterality: Left;    Prior to Admission medications   Not on File    Allergies as of 06/17/2022   (No Known Allergies)    History reviewed. No pertinent family history.  Social History   Socioeconomic History   Marital status: Single    Spouse name: Not on file   Number of children: Not on file   Years of education: Not on file   Highest education level: Not on file  Occupational History   Not on file  Tobacco Use   Smoking status: Every Day    Packs/day: 1.00    Years: 45.00    Total pack years: 45.00    Types: Cigarettes   Smokeless tobacco: Never   Tobacco comments:    Started smoking as teenager  Vaping Use   Vaping Use: Never used  Substance and Sexual Activity   Alcohol use: Yes    Comment: occasional beer   Drug use: Not Currently   Sexual activity: Not on file  Other Topics Concern   Not on file  Social History Narrative   Not on file   Social Determinants of Health   Financial Resource Strain: Not on file  Food Insecurity: Not on file  Transportation Needs: Not on file  Physical Activity: Not on file  Stress: Not on file  Social Connections: Not on file  Intimate Partner Violence: Not on file    Review of Systems: See HPI, otherwise negative ROS  Physical Exam: BP 138/80   Temp 97.9 F (36.6 C) (Tympanic)   Resp 12   Ht 5\' 10"  (1.778 m)   Wt 68 kg   SpO2 97%   BMI 21.51  kg/m  General:   Alert, cooperative in NAD Head:  Normocephalic and atraumatic. Respiratory:  Normal work of breathing. Cardiovascular:  RRR  Impression/Plan: Jack Wells is here for cataract surgery.  Risks, benefits, limitations, and alternatives regarding cataract surgery have been reviewed with the patient.  Questions have been answered.  All parties agreeable.   Birder Robson, MD  08/06/2022, 12:46 PM

## 2022-08-06 NOTE — Anesthesia Preprocedure Evaluation (Addendum)
Anesthesia Evaluation  Patient identified by MRN, date of birth, ID band Patient awake    Reviewed: Allergy & Precautions, NPO status , Patient's Chart, lab work & pertinent test results  History of Anesthesia Complications Negative for: history of anesthetic complications  Airway Mallampati: I   Neck ROM: Full    Dental  (+) Missing, Chipped   Pulmonary Current Smoker (1 ppd)Patient did not abstain from smoking.   Pulmonary exam normal breath sounds clear to auscultation       Cardiovascular hypertension, Normal cardiovascular exam Rhythm:Regular Rate:Normal     Neuro/Psych Blindness     GI/Hepatic negative GI ROS,,,  Endo/Other  diabetes, Type 2    Renal/GU negative Renal ROS     Musculoskeletal   Abdominal   Peds  Hematology negative hematology ROS (+)   Anesthesia Other Findings   Reproductive/Obstetrics                             Anesthesia Physical Anesthesia Plan  ASA: 2  Anesthesia Plan: MAC   Post-op Pain Management:    Induction: Intravenous  PONV Risk Score and Plan: 0 and Treatment may vary due to age or medical condition, Midazolam and TIVA  Airway Management Planned: Natural Airway and Nasal Cannula  Additional Equipment:   Intra-op Plan:   Post-operative Plan:   Informed Consent: I have reviewed the patients History and Physical, chart, labs and discussed the procedure including the risks, benefits and alternatives for the proposed anesthesia with the patient or authorized representative who has indicated his/her understanding and acceptance.     Dental advisory given  Plan Discussed with: CRNA  Anesthesia Plan Comments: (LMA/GETA backup discussed.  Patient consented for risks of anesthesia including but not limited to:  - adverse reactions to medications - damage to eyes, teeth, lips or other oral mucosa - nerve damage due to positioning  - sore  throat or hoarseness - damage to heart, brain, nerves, lungs, other parts of body or loss of life  Informed patient about role of CRNA in peri- and intra-operative care.  Patient voiced understanding.)        Anesthesia Quick Evaluation

## 2022-08-06 NOTE — Op Note (Signed)
PREOPERATIVE DIAGNOSIS:  Nuclear sclerotic cataract of the right eye.   POSTOPERATIVE DIAGNOSIS:  Cataract   OPERATIVE PROCEDURE:ORPROCALL@   SURGEON:  Birder Robson, MD.   ANESTHESIA:  Anesthesiologist: Darrin Nipper, MD CRNA: Gigi Gin, CRNA  1.      Managed anesthesia care. 2.      0.65ml of Shugarcaine was instilled in the eye following the paracentesis.   COMPLICATIONS: Vision Blue was used to stain the anterior capsule due to very poor/ no visualization of the red reflex.    TECHNIQUE:   Stop and chop   DESCRIPTION OF PROCEDURE:  The patient was examined and consented in the preoperative holding area where the aforementioned topical anesthesia was applied to the right eye and then brought back to the Operating Room where the right eye was prepped and draped in the usual sterile ophthalmic fashion and a lid speculum was placed. A paracentesis was created with the side port blade and the anterior chamber was filled with viscoelastic. A near clear corneal incision was performed with the steel keratome. A continuous curvilinear capsulorrhexis was performed with a cystotome followed by the capsulorrhexis forceps. Hydrodissection and hydrodelineation were carried out with BSS on a blunt cannula. The lens was removed in a stop and chop  technique and the remaining cortical material was removed with the irrigation-aspiration handpiece. The capsular bag was inflated with viscoelastic and the Technis ZCB00  lens was placed in the capsular bag without complication. The remaining viscoelastic was removed from the eye with the irrigation-aspiration handpiece. The wounds were hydrated. The anterior chamber was flushed with BSS and the eye was inflated to physiologic pressure. 0.45ml of Vigamox was placed in the anterior chamber. The wounds were found to be water tight. The eye was dressed with Combigan. The patient was given protective glasses to wear throughout the day and a shield with which  to sleep tonight. The patient was also given drops with which to begin a drop regimen today and will follow-up with me in one day. Implant Name Type Inv. Item Serial No. Manufacturer Lot No. LRB No. Used Action  LENS IOL TECNIS EYHANCE 22.5 - [id number] Intraocular Lens LENS IOL TECNIS EYHANCE 22.5 [id number] [employer]  Right 1 Implanted   Procedure(s): CATARACT EXTRACTION PHACO AND INTRAOCULAR LENS PLACEMENT (IOC) RIGHT VISION BLUE 39.36 2:41.0 (Right)  Electronically signed: Birder Robson 08/06/2022 1:16 PM

## 2022-08-06 NOTE — Anesthesia Postprocedure Evaluation (Signed)
Anesthesia Post Note  Patient: Jack Wells  Procedure(s) Performed: CATARACT EXTRACTION PHACO AND INTRAOCULAR LENS PLACEMENT (IOC) RIGHT VISION BLUE 39.36 2:41.0 (Right: Eye)  Patient location during evaluation: PACU Anesthesia Type: MAC Level of consciousness: awake and alert, oriented and patient cooperative Pain management: pain level controlled Vital Signs Assessment: post-procedure vital signs reviewed and stable Respiratory status: spontaneous breathing, nonlabored ventilation and respiratory function stable Cardiovascular status: blood pressure returned to baseline and stable Postop Assessment: adequate PO intake Anesthetic complications: no   No notable events documented.   Last Vitals:  Vitals:   08/06/22 1318 08/06/22 1324  BP: 120/75 124/88  Pulse: 63 73  Resp: 16 11  Temp: (!) 36.1 C (!) 36.1 C  SpO2: 98% 97%    Last Pain:  Vitals:   08/06/22 1324  TempSrc:   PainSc: 0-No pain                 Darrin Nipper

## 2022-08-08 ENCOUNTER — Encounter: Payer: Self-pay | Admitting: Ophthalmology
